# Patient Record
Sex: Male | Born: 1985 | Race: White | Hispanic: No | Marital: Single | Smoking: Never smoker
Health system: Southern US, Community
[De-identification: ages and names within clinical notes are randomized; demographics above are authoritative.]

## PROBLEM LIST (undated history)

## (undated) DIAGNOSIS — Q750 Craniosynostosis: Secondary | ICD-10-CM

## (undated) DIAGNOSIS — Q75009 Craniosynostosis, unspecified: Secondary | ICD-10-CM

## (undated) HISTORY — PX: TONSILLECTOMY: SUR1361

---

## 2015-10-23 ENCOUNTER — Emergency Department (HOSPITAL_COMMUNITY): Payer: No Typology Code available for payment source | Admitting: Certified Registered Nurse Anesthetist

## 2015-10-23 ENCOUNTER — Ambulatory Visit: Admit: 2015-10-23 | Payer: No Typology Code available for payment source | Admitting: Ophthalmology

## 2015-10-23 ENCOUNTER — Ambulatory Visit (HOSPITAL_COMMUNITY)
Admission: EM | Admit: 2015-10-23 | Discharge: 2015-10-24 | Disposition: A | Discharge status: Home / Self Care | Payer: No Typology Code available for payment source | Attending: Ophthalmology | Admitting: Ophthalmology

## 2015-10-23 ENCOUNTER — Emergency Department (HOSPITAL_BASED_OUTPATIENT_CLINIC_OR_DEPARTMENT_OTHER)
Admission: EM | Admit: 2015-10-23 | Discharge: 2015-10-23 | Disposition: A | Discharge status: Home / Self Care | Payer: No Typology Code available for payment source | Attending: Emergency Medicine | Admitting: Emergency Medicine

## 2015-10-23 ENCOUNTER — Encounter (HOSPITAL_COMMUNITY): Admission: EM | Disposition: A | Discharge status: Home / Self Care | Payer: Self-pay | Source: Home / Self Care

## 2015-10-23 ENCOUNTER — Encounter (HOSPITAL_BASED_OUTPATIENT_CLINIC_OR_DEPARTMENT_OTHER): Payer: Self-pay | Admitting: *Deleted

## 2015-10-23 ENCOUNTER — Encounter (HOSPITAL_COMMUNITY): Payer: Self-pay | Admitting: Anesthesiology

## 2015-10-23 DIAGNOSIS — S0532XA Ocular laceration without prolapse or loss of intraocular tissue, left eye, initial encounter: Secondary | ICD-10-CM

## 2015-10-23 DIAGNOSIS — Z23 Encounter for immunization: Secondary | ICD-10-CM | POA: Diagnosis not present

## 2015-10-23 DIAGNOSIS — H1132 Conjunctival hemorrhage, left eye: Secondary | ICD-10-CM | POA: Insufficient documentation

## 2015-10-23 DIAGNOSIS — Y9301 Activity, walking, marching and hiking: Secondary | ICD-10-CM | POA: Insufficient documentation

## 2015-10-23 DIAGNOSIS — W2209XA Striking against other stationary object, initial encounter: Secondary | ICD-10-CM | POA: Insufficient documentation

## 2015-10-23 DIAGNOSIS — Y999 Unspecified external cause status: Secondary | ICD-10-CM | POA: Diagnosis not present

## 2015-10-23 DIAGNOSIS — S01112A Laceration without foreign body of left eyelid and periocular area, initial encounter: Secondary | ICD-10-CM | POA: Insufficient documentation

## 2015-10-23 DIAGNOSIS — Y929 Unspecified place or not applicable: Secondary | ICD-10-CM | POA: Insufficient documentation

## 2015-10-23 DIAGNOSIS — S0592XA Unspecified injury of left eye and orbit, initial encounter: Secondary | ICD-10-CM | POA: Diagnosis present

## 2015-10-23 DIAGNOSIS — X58XXXA Exposure to other specified factors, initial encounter: Secondary | ICD-10-CM | POA: Insufficient documentation

## 2015-10-23 HISTORY — PX: EYE EXAMINATION UNDER ANESTHESIA: SHX1560

## 2015-10-23 HISTORY — DX: Craniosynostosis: Q75.0

## 2015-10-23 HISTORY — PX: EYELID LACERATION REPAIR: SHX5333

## 2015-10-23 HISTORY — DX: Craniosynostosis, unspecified: Q75.009

## 2015-10-23 SURGERY — EXAM UNDER ANESTHESIA, EYE
Anesthesia: General | Site: Eye | Laterality: Left

## 2015-10-23 SURGERY — EXAM UNDER ANESTHESIA, EYE
Anesthesia: General | Laterality: Left

## 2015-10-23 MED ORDER — BSS IO SOLN
INTRAOCULAR | Status: AC
  Filled 2015-10-23: qty 30

## 2015-10-23 MED ORDER — ARTIFICIAL TEARS OP OINT
TOPICAL_OINTMENT | OPHTHALMIC | Status: AC
  Filled 2015-10-23: qty 3.5

## 2015-10-23 MED ORDER — LIDOCAINE HCL 4 % EX SOLN
CUTANEOUS | Status: DC | PRN
  Administered 2015-10-23: 80 mL via TOPICAL

## 2015-10-23 MED ORDER — ONDANSETRON HCL 4 MG/2ML IJ SOLN
INTRAMUSCULAR | Status: DC | PRN
  Administered 2015-10-23: 4 mg via INTRAVENOUS

## 2015-10-23 MED ORDER — MORPHINE SULFATE (PF) 4 MG/ML IV SOLN
4.0000 mg | Freq: Once | INTRAVENOUS | Status: AC
  Administered 2015-10-23: 4 mg via INTRAVENOUS
  Filled 2015-10-23: qty 1

## 2015-10-23 MED ORDER — FENTANYL CITRATE (PF) 100 MCG/2ML IJ SOLN
INTRAMUSCULAR | Status: AC
  Filled 2015-10-23: qty 4

## 2015-10-23 MED ORDER — ONDANSETRON HCL 4 MG/2ML IJ SOLN
4.0000 mg | Freq: Once | INTRAMUSCULAR | Status: AC
  Administered 2015-10-23: 4 mg via INTRAVENOUS

## 2015-10-23 MED ORDER — PROMETHAZINE HCL 25 MG/ML IJ SOLN
INTRAMUSCULAR | Status: AC
  Filled 2015-10-23: qty 1

## 2015-10-23 MED ORDER — CEPHALEXIN 500 MG PO CAPS: 500.0000 mg | ORAL_CAPSULE | Freq: Two times a day (BID) | ORAL | 0 refills | Status: AC

## 2015-10-23 MED ORDER — PROPOFOL 10 MG/ML IV BOLUS
INTRAVENOUS | Status: AC
  Filled 2015-10-23: qty 20

## 2015-10-23 MED ORDER — MIDAZOLAM HCL 2 MG/2ML IJ SOLN
INTRAMUSCULAR | Status: AC
  Filled 2015-10-23: qty 2

## 2015-10-23 MED ORDER — ACETAMINOPHEN 325 MG PO TABS: 325.0000 mg | ORAL_TABLET | ORAL | Status: DC | PRN

## 2015-10-23 MED ORDER — SUCCINYLCHOLINE CHLORIDE 20 MG/ML IJ SOLN
INTRAMUSCULAR | Status: DC | PRN
  Administered 2015-10-23: 40 mg via INTRAVENOUS

## 2015-10-23 MED ORDER — TETRACAINE HCL 0.5 % OP SOLN
OPHTHALMIC | Status: DC | PRN
  Administered 2015-10-23: 2 [drp]

## 2015-10-23 MED ORDER — MIDAZOLAM HCL 5 MG/5ML IJ SOLN
INTRAMUSCULAR | Status: DC | PRN
  Administered 2015-10-23: 2 mg via INTRAVENOUS

## 2015-10-23 MED ORDER — PROPOFOL 10 MG/ML IV BOLUS
INTRAVENOUS | Status: DC | PRN
  Administered 2015-10-23: 200 mg via INTRAVENOUS

## 2015-10-23 MED ORDER — BUPIVACAINE HCL (PF) 0.25 % IJ SOLN
INTRAMUSCULAR | Status: DC | PRN
  Administered 2015-10-23: 30 mL
  Administered 2015-10-23: .5 mL

## 2015-10-23 MED ORDER — ONDANSETRON HCL 4 MG/2ML IJ SOLN
INTRAMUSCULAR | Status: AC
  Filled 2015-10-23: qty 2

## 2015-10-23 MED ORDER — OXYCODONE HCL 5 MG PO TABS
5.0000 mg | ORAL_TABLET | Freq: Once | ORAL | Status: AC | PRN
  Administered 2015-10-24: 5 mg via ORAL

## 2015-10-23 MED ORDER — TOBRAMYCIN 0.3 % OP OINT
TOPICAL_OINTMENT | OPHTHALMIC | Status: DC | PRN
  Administered 2015-10-23: 1 via OPHTHALMIC

## 2015-10-23 MED ORDER — DEXMEDETOMIDINE HCL IN NACL 200 MCG/50ML IV SOLN
INTRAVENOUS | Status: AC
Start: 2015-10-23 — End: 2015-10-23
  Filled 2015-10-23: qty 50

## 2015-10-23 MED ORDER — TETANUS-DIPHTH-ACELL PERTUSSIS 5-2.5-18.5 LF-MCG/0.5 IM SUSP
0.5000 mL | Freq: Once | INTRAMUSCULAR | Status: AC
  Administered 2015-10-23: 0.5 mL via INTRAMUSCULAR
  Filled 2015-10-23: qty 0.5

## 2015-10-23 MED ORDER — ONDANSETRON HCL 4 MG/2ML IJ SOLN: 4.0000 mg | Freq: Once | INTRAMUSCULAR | Status: DC

## 2015-10-23 MED ORDER — PROMETHAZINE HCL 25 MG/ML IJ SOLN
6.2500 mg | INTRAMUSCULAR | Status: DC | PRN
  Administered 2015-10-23: 12.5 mg via INTRAVENOUS

## 2015-10-23 MED ORDER — ROCURONIUM BROMIDE 100 MG/10ML IV SOLN
INTRAVENOUS | Status: DC | PRN
  Administered 2015-10-23: 30 mg via INTRAVENOUS

## 2015-10-23 MED ORDER — ONDANSETRON HCL 4 MG/2ML IJ SOLN
INTRAMUSCULAR | Status: AC
  Filled 2015-10-23: qty 6

## 2015-10-23 MED ORDER — LACTATED RINGERS IV SOLN
INTRAVENOUS | Status: DC | PRN
  Administered 2015-10-23 (×2): via INTRAVENOUS

## 2015-10-23 MED ORDER — TETRACAINE HCL 0.5 % OP SOLN
OPHTHALMIC | Status: AC
  Filled 2015-10-23: qty 2

## 2015-10-23 MED ORDER — CEFAZOLIN SODIUM-DEXTROSE 2-3 GM-% IV SOLR
INTRAVENOUS | Status: DC | PRN
  Administered 2015-10-23: 2 g via INTRAVENOUS

## 2015-10-23 MED ORDER — HYDROMORPHONE HCL 1 MG/ML IJ SOLN
0.2500 mg | INTRAMUSCULAR | Status: DC | PRN
Start: 2015-10-23 — End: 2015-10-24
  Administered 2015-10-24 (×2): 0.5 mg via INTRAVENOUS

## 2015-10-23 MED ORDER — FENTANYL CITRATE (PF) 250 MCG/5ML IJ SOLN
INTRAMUSCULAR | Status: DC | PRN
  Administered 2015-10-23: 100 ug via INTRAVENOUS

## 2015-10-23 MED ORDER — SUGAMMADEX SODIUM 200 MG/2ML IV SOLN
INTRAVENOUS | Status: DC | PRN
  Administered 2015-10-23: 150 mg via INTRAVENOUS

## 2015-10-23 MED ORDER — DEXAMETHASONE SODIUM PHOSPHATE 10 MG/ML IJ SOLN
INTRAMUSCULAR | Status: AC
  Filled 2015-10-23: qty 1

## 2015-10-23 MED ORDER — OXYCODONE HCL 5 MG/5ML PO SOLN: 5.0000 mg | Freq: Once | ORAL | Status: AC | PRN

## 2015-10-23 MED ORDER — BSS IO SOLN
INTRAOCULAR | Status: DC | PRN
  Administered 2015-10-23: 15 mL via INTRAOCULAR

## 2015-10-23 MED ORDER — LIDOCAINE HCL (CARDIAC) 20 MG/ML IV SOLN
INTRAVENOUS | Status: DC | PRN
  Administered 2015-10-23: 40 mg via INTRATRACHEAL

## 2015-10-23 MED ORDER — BUPIVACAINE HCL (PF) 0.25 % IJ SOLN
INTRAMUSCULAR | Status: AC
  Filled 2015-10-23: qty 30

## 2015-10-23 MED ORDER — DEXAMETHASONE SODIUM PHOSPHATE 10 MG/ML IJ SOLN
INTRAMUSCULAR | Status: DC | PRN
  Administered 2015-10-23: 10 mg via INTRAVENOUS

## 2015-10-23 MED ORDER — TOBRAMYCIN-DEXAMETHASONE 0.3-0.1 % OP OINT
TOPICAL_OINTMENT | OPHTHALMIC | Status: AC
  Filled 2015-10-23: qty 3.5

## 2015-10-23 MED ORDER — ACETAMINOPHEN 160 MG/5ML PO SOLN
325.0000 mg | ORAL | Status: DC | PRN
  Filled 2015-10-23: qty 20.3

## 2015-10-23 MED ORDER — SUGAMMADEX SODIUM 200 MG/2ML IV SOLN
INTRAVENOUS | Status: AC
  Filled 2015-10-23: qty 2

## 2015-10-23 SURGICAL SUPPLY — 25 items
BLADE 10 SAFETY STRL DISP (BLADE) IMPLANT
BNDG COHESIVE 4X5 TAN STRL (GAUZE/BANDAGES/DRESSINGS) IMPLANT
CLOSURE WOUND 1/4 X3 (GAUZE/BANDAGES/DRESSINGS) ×1
DRAPE INCISE 51X51 W/FILM STRL (DRAPES) IMPLANT
GAUZE SPONGE 4X4 16PLY XRAY LF (GAUZE/BANDAGES/DRESSINGS) ×3 IMPLANT
GOWN STRL REUS W/ TWL LRG LVL3 (GOWN DISPOSABLE) ×2 IMPLANT
GOWN STRL REUS W/TWL LRG LVL3 (GOWN DISPOSABLE) ×4
KIT ROOM TURNOVER OR (KITS) ×3 IMPLANT
MARKER SKIN DUAL TIP RULER LAB (MISCELLANEOUS) ×3 IMPLANT
NS IRRIG 1000ML POUR BTL (IV SOLUTION) ×3 IMPLANT
PACK CATARACT CUSTOM (CUSTOM PROCEDURE TRAY) ×3 IMPLANT
PAD ARMBOARD 7.5X6 YLW CONV (MISCELLANEOUS) ×6 IMPLANT
PAD EYE OVAL STERILE LF (GAUZE/BANDAGES/DRESSINGS) IMPLANT
STRIP CLOSURE SKIN 1/4X3 (GAUZE/BANDAGES/DRESSINGS) ×2 IMPLANT
SUT PROLENE 8 0 BV130 5 (SUTURE) IMPLANT
SUT SILK 6 0 G 6 (SUTURE) ×3 IMPLANT
SUT VIC AB 4-0 RB1 27 (SUTURE)
SUT VIC AB 4-0 RB1 27X BRD (SUTURE) IMPLANT
SUT VICRYL 8 0 TG140 8 (SUTURE) ×3 IMPLANT
SUT VICRYL ABS 6-0 S29 18IN (SUTURE) IMPLANT
SWAB COLLECTION DEVICE MRSA (MISCELLANEOUS) ×3 IMPLANT
SYR BULB IRRIGATION 50ML (SYRINGE) IMPLANT
TOWEL OR 17X24 6PK STRL BLUE (TOWEL DISPOSABLE) IMPLANT
TUBING BULK SUCTION (MISCELLANEOUS) IMPLANT
WATER STERILE IRR 1000ML POUR (IV SOLUTION) ×3 IMPLANT

## 2015-10-23 NOTE — Discharge Instructions (Signed)
Call [phone] when you arrive. Do not eat or drink on the way to his office. Drive straight there

## 2015-10-23 NOTE — Anesthesia Procedure Notes (Signed)
Procedure Name: Intubation Date/Time: 10/23/2015 10:36 PM Performed by: Hollie Salk Z Pre-anesthesia Checklist: Patient identified, Emergency Drugs available, Suction available and Patient being monitored Patient Re-evaluated:Patient Re-evaluated prior to inductionOxygen Delivery Method: Circle System Utilized Preoxygenation: Pre-oxygenation with 100% oxygen Intubation Type: IV induction Ventilation: Mask ventilation without difficulty Laryngoscope Size: 4 Grade View: Grade I Tube type: Oral Tube size: 8.0 mm Number of attempts: 1 Airway Equipment and Method: Stylet,  Oral airway and LTA kit utilized Placement Confirmation: ETT inserted through vocal cords under direct vision,  positive ETCO2 and breath sounds checked- equal and bilateral Secured at: 24 cm Tube secured with: Tape Dental Injury: Teeth and Oropharynx as per pre-operative assessment

## 2015-10-23 NOTE — Anesthesia Preprocedure Evaluation (Signed)
Anesthesia Evaluation  Patient identified by MRN, date of birth, ID band Patient awake    Reviewed: Allergy & Precautions, NPO status , Patient's Chart, lab work & pertinent test results  History of Anesthesia Complications Negative for: history of anesthetic complications  Airway Mallampati: II  TM Distance: >3 FB Neck ROM: Full    Dental  (+) Teeth Intact   Pulmonary neg pulmonary ROS,  breath sounds clear to auscultation        Cardiovascular negative cardio ROS  Rhythm:Regular     Neuro/Psych negative neurological ROS  negative psych ROS   GI/Hepatic negative GI ROS, Neg liver ROS,   Endo/Other  negative endocrine ROS  Renal/GU negative Renal ROS     Musculoskeletal negative musculoskeletal ROS (+)   Abdominal   Peds  Hematology negative hematology ROS (+)   Anesthesia Other Findings   Reproductive/Obstetrics                             Anesthesia Physical Anesthesia Plan  ASA: I  Anesthesia Plan: General   Post-op Pain Management:    Induction: Intravenous  Airway Management Planned: Oral ETT  Additional Equipment: None  Intra-op Plan:   Post-operative Plan: Extubation in OR  Informed Consent: I have reviewed the patients History and Physical, chart, labs and discussed the procedure including the risks, benefits and alternatives for the proposed anesthesia with the patient or authorized representative who has indicated his/her understanding and acceptance.   Dental advisory given  Plan Discussed with: CRNA and Surgeon  Anesthesia Plan Comments:         Anesthesia Quick Evaluation  

## 2015-10-23 NOTE — H&P (Addendum)
Ophthalmology H&P  This is a 30 yo male with a h/o trauma to the left eye today with eyelid laceration and conjunctival laceration.  Pt with pain and redness and bleedinng from eyelid.  PMHx: craniosynostosis PSHx: tonsillectomy Psocial hx: smokes occasionally Medications: none Allergies:  NKDA  ROS: eye pain left side  Eye Exam:  Vision 20/25 in both eyes, IOP 20 ou, PERRL, extraocular motility intact, Visual Field full to confrontation  On slit lamp exam pt was found to have eyelid laceration of medial aspect of left lower eyelid, pt with conjunctival laceration laterally, inferiorly, and nasally with subconjunctival hemorrhage, cornea clear in both eyes.  Iris reactive and round in both eyes, no anterior chamber reaction in either eye, lens clear in both eyes.  Dilated exam showed c/d of 0.4 in both eyes with normal appearing fundus in both eyes with no commotio retinae.  A/P 1.trauma left eye with eyelid laceration and conjunctival laceration in the left eye.  Due to subconjunctival hemorrhage can not rule out scleral involvement.  Will take back to operating room for exploration and repair of conjunctiva and eyelid laceration.  Pt will start on keflex 500mg  bid, durezol bid, and besivance tid.  Pt had tetanus vaccine at urgent care.    Consent was signed and the appropriate eye being the left eye was identified by the patient and marked by the surgeon.

## 2015-10-23 NOTE — Discharge Instructions (Signed)
Pt to be discharged home.  Condition stable.  Pt to wear shield except when using drops.  Pt to take the following medications: Keflex 1 tablet by mouth two times a day for 10 days Durezol 1 drop left eye two times a day Besivance 1 drop left eye three times a day  Pt has follow up appointment with me in [place] at 10:30 am on Thursday 10/25/2015  Mia Creekimothy Daionna Crossland, M.D. [address] [place] [id number] (cell) [phone] (office) [phone]

## 2015-10-23 NOTE — ED Provider Notes (Signed)
[facility] Note   CSN: [id number] Arrival date & time: 10/23/15  1950   By signing my name below, I, Jacob Oneill, attest that this documentation has been prepared under the direction and in the presence of Pricilla Loveless, MD . Electronically Signed: Modena Oneill, Scribe. 10/23/2015. 8:00 PM.  History   Chief Complaint Chief Complaint  Patient presents with  . Eye Injury   The history is provided by the patient. No language interpreter was used.   HPI Comments: Jacob Oneill is a 30 y.o. male who presents to the Emergency Department complaining of a left eye injury that occurred about 10 minutes ago. He states that a pool stick hit his left eye after he ambulated into a closet. Associated left eye symptoms include constant moderate pain, eyelid wound, active bleeding, and redness. Reports closing left eye provides minimal relief for symptoms. Unsure of tetanus status. Denies any use of eye contacts or visual disturbance.   Past Medical History:  Diagnosis Date  . Craniosynostosis     There are no active problems to display for this patient.   Past Surgical History:  Procedure Laterality Date  . TONSILLECTOMY         Home Medications    Prior to Admission medications   Not on File    Family History History reviewed. No pertinent family history.  Social History Social History  Substance Use Topics  . Smoking status: Never Smoker  . Smokeless tobacco: Never Used  . Alcohol use No     Allergies   Review of patient's allergies indicates no known allergies.   Review of Systems Review of Systems  Eyes: Positive for pain and redness. Negative for visual disturbance.  Skin: Positive for wound.  All other systems reviewed and are negative.    Physical Exam Updated Vital Signs Ht 6\' 3"  (1.905 m)   Wt 185 lb (83.9 kg)   BMI 23.12 kg/m   Physical Exam  Constitutional: He is oriented to person, place, and time. He appears  well-developed and well-nourished.  HENT:  Head: Normocephalic and atraumatic.  Right Ear: External ear normal.  Left Ear: External ear normal.  Nose: Nose normal.  Eyes: EOM are normal. Pupils are equal, round, and reactive to light. Right eye exhibits no discharge. Left eye exhibits no discharge.  Left lower eyelid laceration. In inferior medial aspect of eye, there is a laceration of at least the conjunctiva, as well as over his medial canthus.   Neck: Neck supple.  Cardiovascular: Normal rate, regular rhythm and normal heart sounds.   Pulmonary/Chest: Effort normal and breath sounds normal.  Abdominal: Soft. There is no tenderness.  Musculoskeletal: He exhibits no edema.  Neurological: He is alert and oriented to person, place, and time.  Skin: Skin is warm and dry.  Nursing note and vitals reviewed.      ED Treatments / Results  DIAGNOSTIC STUDIES:  COORDINATION OF CARE: 8:04 PM- Pt advised of plan for treatment and pt agrees.  Labs (all labs ordered are listed, but only abnormal results are displayed) Labs Reviewed - No data to display  EKG  EKG Interpretation None       Radiology No results found.  Procedures Procedures (including critical care time)  Medications Ordered in ED Medications  Tdap (BOOSTRIX) injection 0.5 mL (not administered)  morphine 4 MG/ML injection 4 mg (not administered)     Initial Impression / Assessment and Plan / ED Course  I have reviewed the triage  vital signs and the nursing notes.  Pertinent labs & imaging results that were available during my care of the patient were reviewed by me and considered in my medical decision making (see chart for details).  Clinical Course  Comment By Time  D/w Dr. Vonna KotykBevis, send straight to his office, patient will call Doc's phone when arrived. NPO, straight there. Pricilla LovelessScott Norman Piacentini, MD 09/26 2016    Given tdap here. Visual acuity with 20/30 vision in left. IV morphine prior to discharge, mom is  driving him to the ophthalmologist.  Final Clinical Impressions(s) / ED Diagnoses   Final diagnoses:  Eye laceration, left, initial encounter    New Prescriptions New Prescriptions   No medications on file   I personally performed the services described in this documentation, which was scribed in my presence. The recorded information has been reviewed and is accurate.     Pricilla LovelessScott Kolby Myung, MD 10/24/15 1146

## 2015-10-23 NOTE — ED Notes (Signed)
Patient went straight to OR

## 2015-10-23 NOTE — ED Triage Notes (Addendum)
Pt reports that he walked into a closet and a pool stick fell out and cut his eye.  Noted to have a laceration to the inside corner of his left eye.  Noted to have a laceration on his globe. No change in vision, full ROM to eye.

## 2015-10-23 NOTE — Transfer of Care (Signed)
Immediate Anesthesia Transfer of Care Note  Patient: Jacob Oneill  Procedure(s) Performed: Procedure(s): EYE EXAM UNDER ANESTHESIA (Left) Repair of Conjunctival laceration and eyelid laceration Left eye (Left)  Patient Location: PACU  Anesthesia Type:General  Level of Consciousness: awake, alert , oriented and patient cooperative  Airway & Oxygen Therapy: Patient Spontanous Breathing and Patient connected to nasal cannula oxygen  Post-op Assessment: Report given to RN and Post -op Vital signs reviewed and stable  Post vital signs: Reviewed and stable  Last Vitals: There were no vitals filed for this visit.  Last Pain: There were no vitals filed for this visit.       Complications: No apparent anesthesia complications

## 2015-10-23 NOTE — ED Notes (Signed)
Called for triage and unable to locate ?

## 2015-10-23 NOTE — Op Note (Signed)
Ophthalmology Operative Note  Indication for procedure:   This is a 30 yo male with h/o trauma to left eye with eyelid laceration and conjunctival laceration and subconjunctival hemorrhage.  Pt needed exploration of left eye to rule out open globe and repair of conjunctival laceration and eyelid laceration. Risks and benefits were discussed with patient and patient elected for the surgery.  Description of procedure:  The patient was identified in the preop area and the left eye was identified by the patient and marked by the surgeon.  The patient was taken back to the operative suite were leads and monitors were placed and the patient was placed under general anesthesia.  The eye was prepped and draped in the usual sterile fashion for microophthalmic surgery.  The eye had a normal pressure and no open globe was detected prior to surgery.  Vision was excellent and eye was seidel negative.  The patient had an eye speculum placed and the surgery was initiated.  The eye was explored and there was no scleral invlvement.  The conjunctival lacerations were repaired using 8-0 vicryl sutures.  Once repaired attention was made to the eyelid.  The speculum was removed and the laceration which was partial thickness was repaired with interupted 6-0 silk sutures.  The most inner suture was left with long ends which were steri stripped down to the skin so as not to irritate the eye.  Tobramycin ointment was placed in the eye as well as 1 drop of tetracaine and lidocaine was injected near the wound for comfort care.  A clear shield was placed over the eye and the patient was taken to the recovery room in stable condition with instructions to follow [place] in [place].  The patient has my cellphone number in case he has any issues.  Pt received ancef during surgery and will use keflex 500mg  bid for 1 week.  The patient will also use durezol bid and besivance tid.

## 2015-10-24 ENCOUNTER — Encounter (HOSPITAL_COMMUNITY): Payer: Self-pay | Admitting: Ophthalmology

## 2015-10-24 DIAGNOSIS — S01112A Laceration without foreign body of left eyelid and periocular area, initial encounter: Secondary | ICD-10-CM | POA: Diagnosis not present

## 2015-10-24 MED ORDER — HYDROMORPHONE HCL 1 MG/ML IJ SOLN
INTRAMUSCULAR | Status: AC
  Filled 2015-10-24: qty 1

## 2015-10-24 MED ORDER — OXYCODONE HCL 5 MG PO TABS
ORAL_TABLET | ORAL | Status: AC
  Filled 2015-10-24: qty 1

## 2015-10-24 NOTE — Anesthesia Postprocedure Evaluation (Signed)
Anesthesia Post Note  Patient: Jacob Oneill  Procedure(s) Performed: Procedure(s) (LRB): EYE EXAM UNDER ANESTHESIA (Left) Repair of Conjunctival laceration and eyelid laceration Left eye (Left)  Patient location during evaluation: PACU Anesthesia Type: General Level of consciousness: awake Pain management: pain level controlled Vital Signs Assessment: post-procedure vital signs reviewed and stable Respiratory status: spontaneous breathing Cardiovascular status: stable Postop Assessment: no signs of nausea or vomiting Anesthetic complications: no    Last Vitals:  Vitals:   10/24/15 0015 10/24/15 0030  BP: (!) 154/101   Pulse: 66 83  Resp: 15 (!) 22  Temp:  36.5 C    Last Pain:  Vitals:   10/24/15 0015  PainSc: 4                  Lajeana Strough

## 2015-10-26 ENCOUNTER — Ambulatory Visit (HOSPITAL_BASED_OUTPATIENT_CLINIC_OR_DEPARTMENT_OTHER)
Admission: RE | Admit: 2015-10-26 | Discharge: 2015-10-26 | Disposition: A | Discharge status: Home / Self Care | Payer: No Typology Code available for payment source | Source: Ambulatory Visit | Attending: Ophthalmology | Admitting: Ophthalmology

## 2015-10-26 ENCOUNTER — Other Ambulatory Visit (HOSPITAL_BASED_OUTPATIENT_CLINIC_OR_DEPARTMENT_OTHER): Payer: Self-pay | Admitting: Ophthalmology

## 2015-10-26 DIAGNOSIS — S0230XB Fracture of orbital floor, unspecified side, initial encounter for open fracture: Secondary | ICD-10-CM

## 2015-10-26 DIAGNOSIS — M7989 Other specified soft tissue disorders: Secondary | ICD-10-CM | POA: Insufficient documentation

## 2018-01-15 IMAGING — CT CT ORBITS W/O CM
3 series · 14 of 47 positions shown, 16 images · non-contrast
Comparison: None.

CLINICAL DATA: Left eye injury.  Rule out blowout fracture

EXAM:
CT ORBITS WITHOUT CONTRAST
TECHNIQUE: Multidetector CT imaging of the orbits was performed following the
standard protocol without intravenous contrast.

[Series 3: orbits 2.0 h30s st · axial · 0.37mm/px · z∈[-197,-115]mm · 8 of 49 slices shown, 10 images]
[im 4/49  brain]
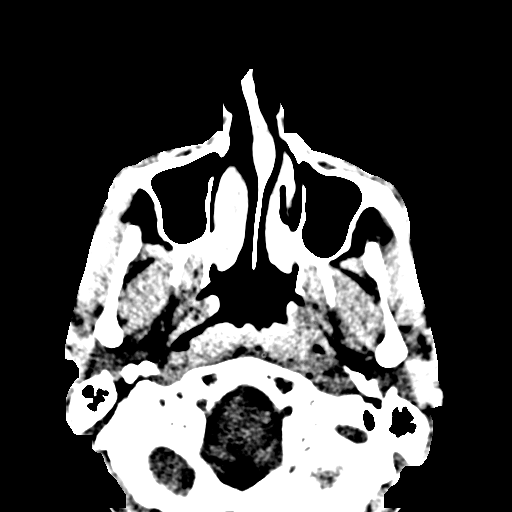
[im 4/49  bone]
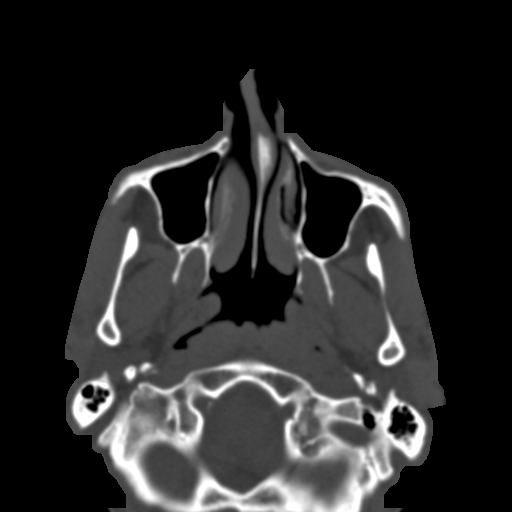
[im 10/49  bone]
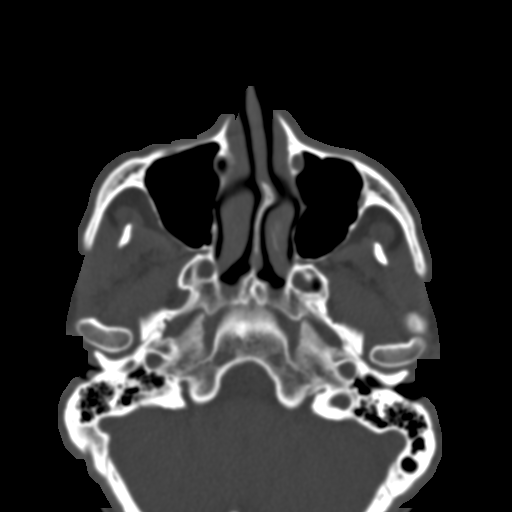
[im 15/49  bone]
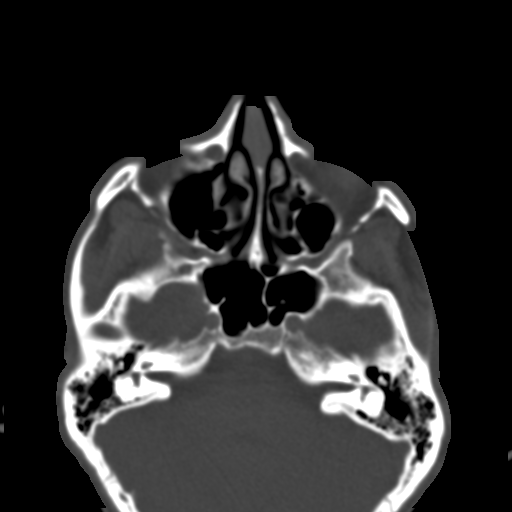
[im 22/49  bone]
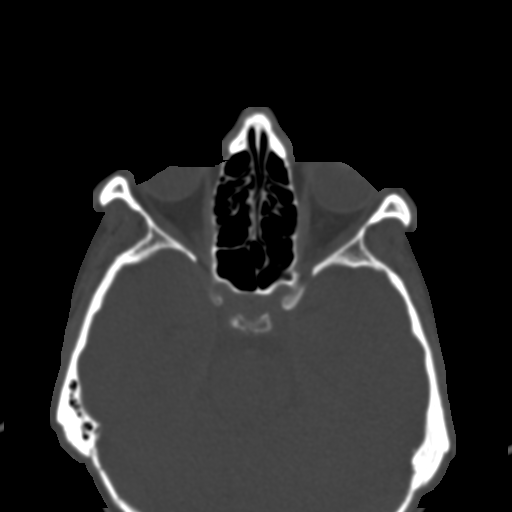
[im 27/49  brain]
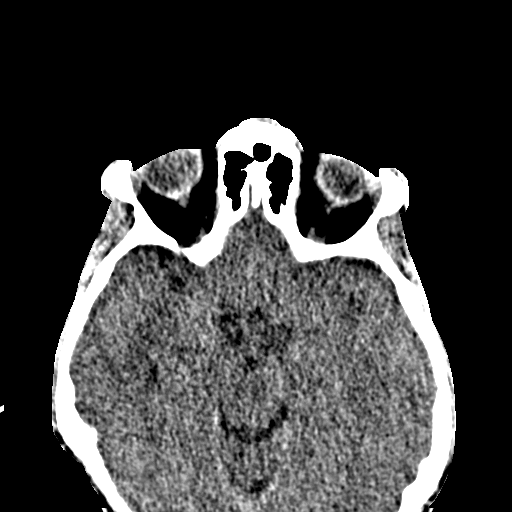
[im 27/49  bone]
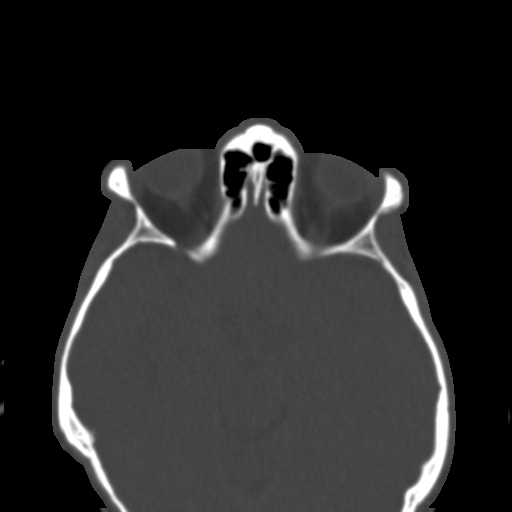
[im 34/49  bone]
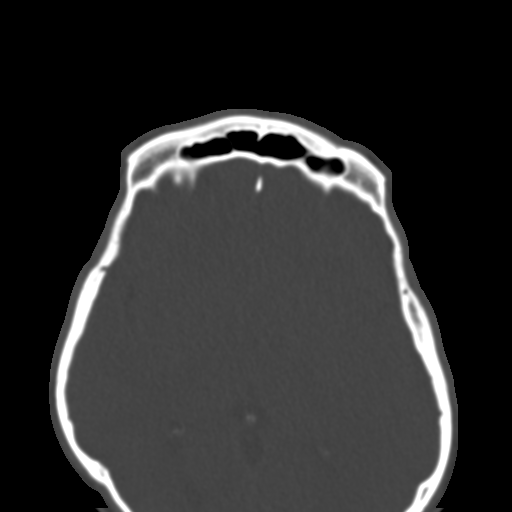
[im 39/49  bone]
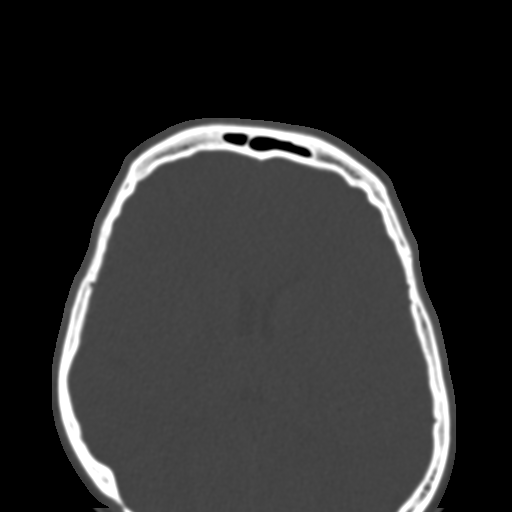
[im 45/49  bone]
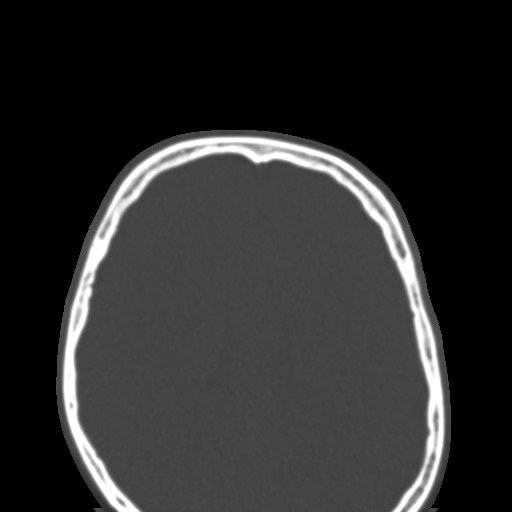

[Series 7: orbits 2.0 coronal · coronal · 0.22mm/px · 3 of 67 slices shown]
[im 23/67  bone]
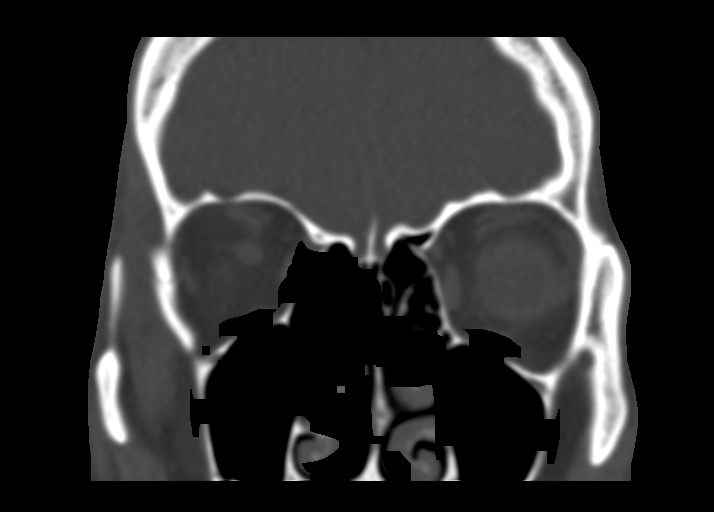
[im 30/67  bone]
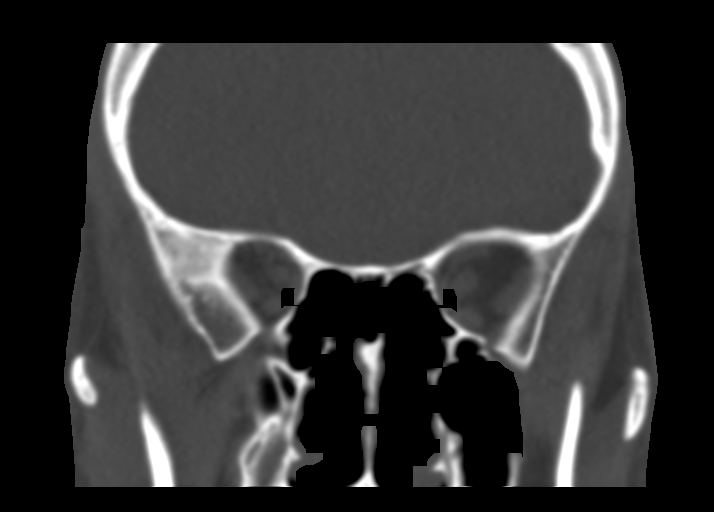
[im 37/67  bone]
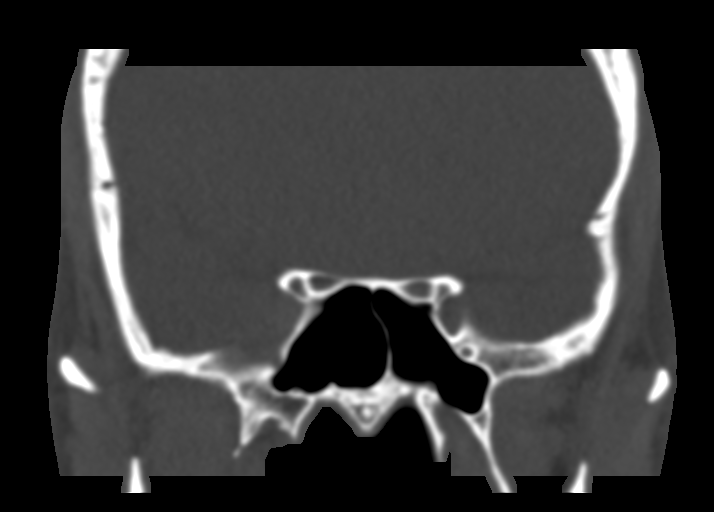

[Series 9: orbits 2.0 sagittal · sagittal · 0.22mm/px · 3 of 86 slices shown]
[im 29/86  bone]
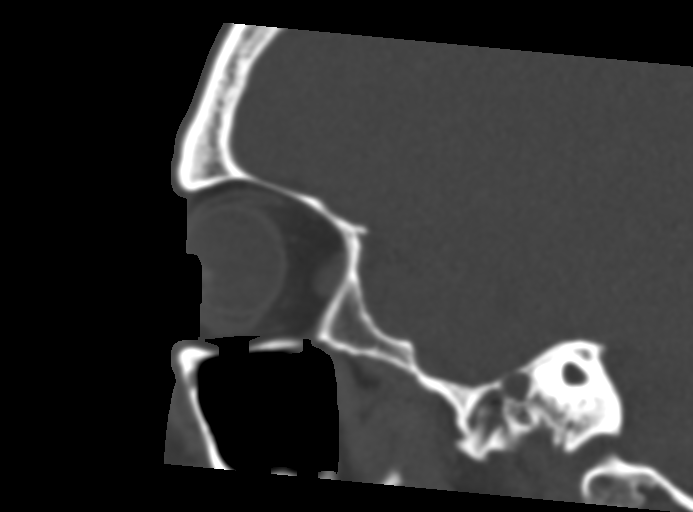
[im 43/86  bone]
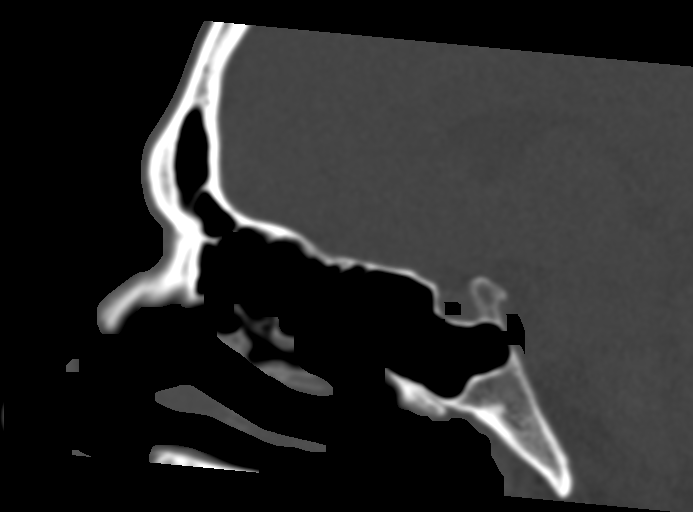
[im 57/86  bone]
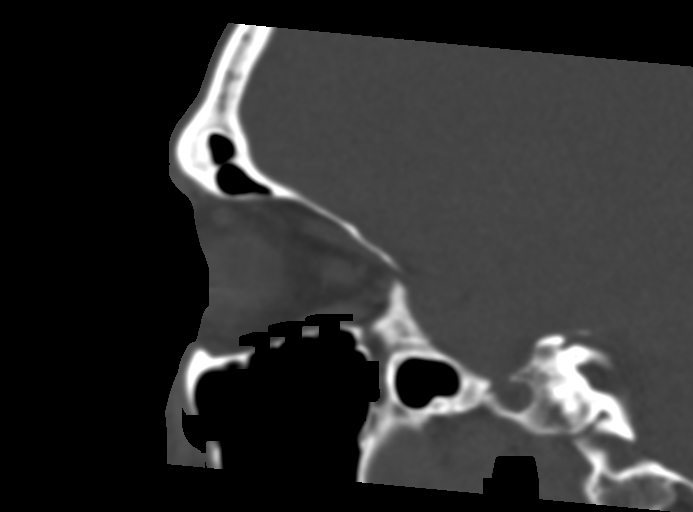

[14 of 47 positions shown; findings below may reference images not displayed]

FINDINGS: Orbits:  Negative for orbital fracture.

No facial fracture.  Nasal bone intact.

Visualized sinuses: Clear

Soft tissues: Mild soft tissue swelling in the region of the left
lacrimal gland which may be due to contusion. No other soft tissue
swelling in the orbit. No foreign body or gas within the orbit.
Extraocular muscles and optic nerve normal in caliber bilaterally.

Limited intracranial: Negative
IMPRESSION: Negative for orbital fracture

Mild soft tissue swelling in the region of the left lacrimal gland
which may be due to contusion. No foreign body in the orbit.

These results will be called to the ordering clinician or
representative by the Radiologist Assistant, and communication
documented in the PACS or zVision Dashboard.
